# Patient Record
Sex: Male | Born: 1970 | Race: Black or African American | Hispanic: No | Marital: Married
Health system: Southern US, Community
[De-identification: ages and names within clinical notes are randomized; demographics above are authoritative.]

## PROBLEM LIST (undated history)

## (undated) DIAGNOSIS — H409 Unspecified glaucoma: Secondary | ICD-10-CM

---

## 2020-08-03 DIAGNOSIS — H35423 Microcystoid degeneration of retina, bilateral: Secondary | ICD-10-CM | POA: Diagnosis not present

## 2020-08-03 DIAGNOSIS — H18523 Epithelial (juvenile) corneal dystrophy, bilateral: Secondary | ICD-10-CM | POA: Diagnosis not present

## 2020-08-03 DIAGNOSIS — H5213 Myopia, bilateral: Secondary | ICD-10-CM | POA: Diagnosis not present

## 2020-08-03 DIAGNOSIS — H401231 Low-tension glaucoma, bilateral, mild stage: Secondary | ICD-10-CM | POA: Diagnosis not present

## 2021-11-15 DIAGNOSIS — H35423 Microcystoid degeneration of retina, bilateral: Secondary | ICD-10-CM | POA: Diagnosis not present

## 2021-11-15 DIAGNOSIS — H401231 Low-tension glaucoma, bilateral, mild stage: Secondary | ICD-10-CM | POA: Diagnosis not present

## 2021-11-15 DIAGNOSIS — H18523 Epithelial (juvenile) corneal dystrophy, bilateral: Secondary | ICD-10-CM | POA: Diagnosis not present

## 2021-11-15 DIAGNOSIS — H5213 Myopia, bilateral: Secondary | ICD-10-CM | POA: Diagnosis not present

## 2021-12-19 ENCOUNTER — Emergency Department (HOSPITAL_BASED_OUTPATIENT_CLINIC_OR_DEPARTMENT_OTHER): Payer: BC Managed Care – PPO

## 2021-12-19 ENCOUNTER — Emergency Department (HOSPITAL_BASED_OUTPATIENT_CLINIC_OR_DEPARTMENT_OTHER)
Admission: EM | Admit: 2021-12-19 | Discharge: 2021-12-19 | Disposition: A | Discharge status: Home / Self Care | Payer: BC Managed Care – PPO | Attending: Emergency Medicine | Admitting: Emergency Medicine

## 2021-12-19 ENCOUNTER — Encounter (HOSPITAL_BASED_OUTPATIENT_CLINIC_OR_DEPARTMENT_OTHER): Payer: Self-pay | Admitting: Emergency Medicine

## 2021-12-19 ENCOUNTER — Other Ambulatory Visit: Payer: Self-pay

## 2021-12-19 DIAGNOSIS — N132 Hydronephrosis with renal and ureteral calculous obstruction: Secondary | ICD-10-CM | POA: Diagnosis not present

## 2021-12-19 DIAGNOSIS — N201 Calculus of ureter: Secondary | ICD-10-CM | POA: Diagnosis not present

## 2021-12-19 DIAGNOSIS — R1032 Left lower quadrant pain: Secondary | ICD-10-CM | POA: Diagnosis not present

## 2021-12-19 DIAGNOSIS — N134 Hydroureter: Secondary | ICD-10-CM | POA: Diagnosis not present

## 2021-12-19 DIAGNOSIS — R110 Nausea: Secondary | ICD-10-CM | POA: Diagnosis not present

## 2021-12-19 DIAGNOSIS — K920 Hematemesis: Secondary | ICD-10-CM

## 2021-12-19 DIAGNOSIS — R109 Unspecified abdominal pain: Secondary | ICD-10-CM | POA: Diagnosis not present

## 2021-12-19 HISTORY — DX: Unspecified glaucoma: H40.9

## 2021-12-19 LAB — COMPREHENSIVE METABOLIC PANEL
ALT: 34 U/L (ref 0–44)
AST: 30 U/L (ref 15–41)
Albumin: 4 g/dL (ref 3.5–5.0)
Alkaline Phosphatase: 47 U/L (ref 38–126)
Anion gap: 9 (ref 5–15)
BUN: 17 mg/dL (ref 6–20)
CO2: 26 mmol/L (ref 22–32)
Calcium: 9 mg/dL (ref 8.9–10.3)
Chloride: 102 mmol/L (ref 98–111)
Creatinine, Ser: 1.17 mg/dL (ref 0.61–1.24)
GFR, Estimated: >60 mL/min (ref >60)
Glucose, Bld: 150 mg/dL — ABNORMAL HIGH (ref 70–99)
Potassium: 4.1 mmol/L (ref 3.5–5.1)
Sodium: 137 mmol/L (ref 135–145)
Total Bilirubin: 0.8 mg/dL (ref 0.3–1.2)
Total Protein: 7.7 g/dL (ref 6.5–8.1)

## 2021-12-19 LAB — URINALYSIS, ROUTINE W REFLEX MICROSCOPIC
Bilirubin Urine: NEGATIVE
Glucose, UA: NEGATIVE mg/dL
Ketones, ur: NEGATIVE mg/dL
Leukocytes,Ua: NEGATIVE
Nitrite: NEGATIVE
Protein, ur: NEGATIVE mg/dL
Specific Gravity, Urine: 1.025 (ref 1.005–1.030)
pH: 6 (ref 5.0–8.0)

## 2021-12-19 LAB — CBC
HCT: 40.3 % (ref 39.0–52.0)
Hemoglobin: 13.2 g/dL (ref 13.0–17.0)
MCH: 28.8 pg (ref 26.0–34.0)
MCHC: 32.8 g/dL (ref 30.0–36.0)
MCV: 87.8 fL (ref 80.0–100.0)
Platelets: 166 10*3/uL (ref 150–400)
RBC: 4.59 MIL/uL (ref 4.22–5.81)
RDW: 15.2 % (ref 11.5–15.5)
WBC: 8.2 10*3/uL (ref 4.0–10.5)
nRBC: 0 % (ref 0.0–0.2)

## 2021-12-19 LAB — URINALYSIS, MICROSCOPIC (REFLEX)

## 2021-12-19 LAB — LIPASE, BLOOD: Lipase: 31 U/L (ref 11–51)

## 2021-12-19 MED ORDER — ACETAMINOPHEN 325 MG PO TABS
650.0000 mg | ORAL_TABLET | Freq: Once | ORAL | Status: AC
  Administered 2021-12-19: 650 mg via ORAL
  Filled 2021-12-19: qty 2

## 2021-12-19 MED ORDER — KETOROLAC TROMETHAMINE 15 MG/ML IJ SOLN
15.0000 mg | Freq: Once | INTRAMUSCULAR | Status: DC
  Filled 2021-12-19: qty 1

## 2021-12-19 MED ORDER — IOHEXOL 300 MG/ML  SOLN
100.0000 mL | Freq: Once | INTRAMUSCULAR | Status: AC | PRN
  Administered 2021-12-19: 100 mL via INTRAVENOUS

## 2021-12-19 MED ORDER — ONDANSETRON HCL 4 MG/2ML IJ SOLN
4.0000 mg | Freq: Once | INTRAMUSCULAR | Status: AC
Start: 2021-12-19 — End: 2021-12-19
  Administered 2021-12-19: 4 mg via INTRAVENOUS
  Filled 2021-12-19: qty 2

## 2021-12-19 MED ORDER — OXYCODONE HCL 5 MG PO TABS: 5.0000 mg | ORAL_TABLET | Freq: Three times a day (TID) | ORAL | 0 refills | Status: AC | PRN

## 2021-12-19 MED ORDER — TAMSULOSIN HCL 0.4 MG PO CAPS: 0.4000 mg | ORAL_CAPSULE | Freq: Every day | ORAL | 0 refills | Status: AC

## 2021-12-19 NOTE — ED Notes (Signed)
Patient transported to CT 

## 2021-12-19 NOTE — ED Provider Notes (Signed)
?[facility] EMERGENCY DEPARTMENT ?Provider Note ? ? ?CSN: [id number] ?Arrival date & time: 12/19/21  1142 ? ?  ? ?History ? ?Chief Complaint  ?Patient presents with  ? Flank Pain  ? ? ?Martin Robbins is a 51 y.o. male presenting with left lower quadrant and flank pain.  Reports that this morning while taking his daughter to school he had a sudden onset of left-sided abdominal pain.  Says that he felt as though he needed to take a bowel movement so he went home to have a bowel movement which did not relieve his pain.  Went to work however pain continued and he had to go home.  At home he had 1 episode of hematemesis which prompted his emergency department visit today.  Denies history of GI bleed, NSAID use, alcohol use, history of UC/Crohn's/colorectal cancer.  Is not on blood thinners.  Does not have a GI provider and has never had an endoscopy/colonoscopy. ? ? ?Home Medications ?Prior to Admission medications   ?Not on File  ?   ? ?Allergies    ?Patient has no allergy information on record.   ? ?Review of Systems   ?Review of Systems  ?Gastrointestinal:  Positive for abdominal pain, nausea and vomiting. Negative for blood in stool, constipation and diarrhea.  ?Genitourinary:  Positive for flank pain. Negative for dysuria and hematuria.  ?Neurological:  Negative for dizziness and weakness.  ? ?Physical Exam ?Updated Vital Signs ?BP 114/66   Pulse 76   Temp 97.6 ?F (36.4 ?C) (Oral)   Resp 18   Ht 5\' 7"  (1.702 m)   Wt 83.9 kg   SpO2 100%   BMI 28.98 kg/m?  ?Physical Exam ?Vitals and nursing note reviewed.  ?Constitutional:   ?   General: He is not in acute distress. ?   Appearance: Normal appearance. He is not ill-appearing.  ?HENT:  ?   Head: Normocephalic and atraumatic.  ?Eyes:  ?   General: No scleral icterus. ?   Conjunctiva/sclera: Conjunctivae normal.  ?Cardiovascular:  ?   Rate and Rhythm: Normal rate and regular rhythm.  ?Pulmonary:  ?   Effort: Pulmonary effort is normal. No  respiratory distress.  ?Abdominal:  ?   General: Abdomen is flat.  ?   Palpations: Abdomen is soft.  ?   Tenderness: There is abdominal tenderness (Tenderness localized to muscles of the left flank, no CVA tenderness, mild left lower quadrant tenderness). There is no right CVA tenderness or left CVA tenderness.  ?Skin: ?   General: Skin is warm and dry.  ?   Findings: No rash.  ?Neurological:  ?   Mental Status: He is alert.  ?Psychiatric:     ?   Mood and Affect: Mood normal.     ?   Behavior: Behavior normal.  ? ? ?ED Results / Procedures / Treatments   ?Labs ?(all labs ordered are listed, but only abnormal results are displayed) ?Labs Reviewed  ?COMPREHENSIVE METABOLIC PANEL - Abnormal; Notable for the following components:  ?    Result Value  ? Glucose, Bld 150 (*)   ? All other components within normal limits  ?URINALYSIS, ROUTINE W REFLEX MICROSCOPIC - Abnormal; Notable for the following components:  ? Hgb urine dipstick TRACE (*)   ? All other components within normal limits  ?URINALYSIS, MICROSCOPIC (REFLEX) - Abnormal; Notable for the following components:  ? Bacteria, UA FEW (*)   ? All other components within normal limits  ?LIPASE, BLOOD  ?CBC  ? ? ?  EKG ?None ? ?Radiology ?CT ABDOMEN PELVIS W CONTRAST ? ?Result Date: 12/19/2021 ?CLINICAL DATA:  Abdominal pain, acute, nonlocalized EXAM: CT ABDOMEN AND PELVIS WITH CONTRAST TECHNIQUE: Multidetector CT imaging of the abdomen and pelvis was performed using the standard protocol following bolus administration of intravenous contrast. RADIATION DOSE REDUCTION: This exam was performed according to the departmental dose-optimization program which includes automated exposure control, adjustment of the mA and/or kV according to patient size and/or use of iterative reconstruction technique. CONTRAST:  OMNIPAQUE IOHEXOL 300 MG/ML  SOLN COMPARISON:  None. FINDINGS: Lower chest: No acute abnormality. Hepatobiliary: No focal liver abnormality is seen. The  gallbladder is unremarkable. Pancreas: Unremarkable. No pancreatic ductal dilatation or surrounding inflammatory changes. Spleen: Normal in size without focal abnormality. Adrenals/Urinary Tract: Adrenal glands are unremarkable. There is moderate left-sided hydroureteronephrosis with perinephric and periureteral stranding due to an obstructing 4 mm stone at the left ureterovesicular junction. Mild bladder distension. The right kidney is unremarkable. Stomach/Bowel: The stomach is within normal limits. There is no evidence of bowel obstruction.The appendix is normal in caliber with minimal hyperdense material within the midportion. No periappendiceal stranding. Vascular/Lymphatic: No significant vascular findings are present. No enlarged abdominal or pelvic lymph nodes. Reproductive: Unremarkable. Other: No abdominal wall hernia or abnormality. No abdominopelvic ascites. Musculoskeletal: No acute or significant osseous findings. IMPRESSION: Obstructing 4 mm stone at the left ureterovesicular junction with upstream moderate left hydroureteronephrosis, perinephric and periureteral stranding. Electronically Signed   By: Caprice Renshaw M.D.   On: 12/19/2021 14:22   ? ?Procedures ?Procedures  ? ? ?Medications Ordered in ED ?Medications  ?ondansetron (ZOFRAN) injection 4 mg (4 mg Intravenous Given 12/19/21 1445)  ?iohexol (OMNIPAQUE) 300 MG/ML solution 100 mL (100 mLs Intravenous Contrast Given 12/19/21 1358)  ?acetaminophen (TYLENOL) tablet 650 mg (650 mg Oral Given 12/19/21 1444)  ? ? ?ED Course/ Medical Decision Making/ A&P ?  ?                        ?Medical Decision Making ?Amount and/or Complexity of Data Reviewed ?Labs: ordered. ?Radiology: ordered. ? ?Risk ?OTC drugs. ?Prescription drug management. ? ? ?Patient presents to the ED for concern of left flank/lower quadrant pain and hematemesis.  He has a photo of his hematemesis which appears bright red.  Differential includes but is not limited to diverticulitis,  gastroenteritis, pyelonephritis, nephrolithiasis, GI bleed, peptic ulcer disease and gastritis.  ? ?I reviewed patient's external charts with [facility] and there are no pertinent findings. ? ?I performed a full physical exam, pertinent findings include: ?Moderate tenderness to left lower quadrant.  Negative CVA bilaterally ? ?Diagnostics: ? ?I ordered and reviewed labs and the pertinent are as follows: ?Normal white blood cell count, normal kidney function and microcytic hematuria ? ? ?I ordered and viewed patient's CT abdomen which was revealing of a 4 mm UVJ stone with some associated hydronephrosis. ? ? ?Treatment: ? ?I ordered Tylenol for patient's pain.  He was hesitant to take any NSAIDs due to his episode of hematemesis. ? ?MDM/Disposition: ? ?51 year old male diagnosed with a UVJ stone.  This does not explain his episode of hematemesis.  Because his hemoglobin is stable I believe he is stable to follow-up with gastroenterology outpatient about his episode of hematemesis for a potential endoscopy.  He reports he has not had a colonoscopy so it is a good idea for him to establish care with them anyways. ? ?Patient will probably pass this kidney stone on his own  however I have sent Flomax and oxycodone to his pharmacy to assist.  He has been given a referral to urology for him to follow-up with as needed if he continues to have symptoms and is unable to pass the stone alone. ? ? ?He denies any further questions and voices understanding of return precautions. ? ? ?Final Clinical Impression(s) / ED Diagnoses ?Final diagnoses:  ?Calculus of ureterovesical junction (UVJ)  ?Hematemesis without nausea  ? ? ?Rx / DC Orders ?ED Discharge Orders   ? ?      Ordered  ?  tamsulosin (FLOMAX) 0.4 MG CAPS capsule  Daily after breakfast       ? 12/19/21 1533  ?  oxyCODONE (ROXICODONE) 5 MG immediate release tablet  3 times daily PRN       ? 12/19/21 1533  ? ?  ?  ? ?  ? ?Results and diagnoses were explained to the patient.  Return precautions discussed in full. Patient had no additional questions and expressed complete understanding. ? ? ?This chart was dictated using voice recognition software.  Despite best efforts to proofread,  errors can occur whi

## 2021-12-19 NOTE — ED Notes (Signed)
Pt refused Toradol, He states he does not feel comfortable taking it.  He states he vomited blood and since Toradol is an NSAID, he does not wish to take it.  EDP notfied ?

## 2021-12-19 NOTE — Discharge Instructions (Addendum)
Please follow-up with the urology office if you are still having symptoms after the next week.  Only use the oxycodone for severe pain, otherwise you may take Tylenol. ? ?There is a GI practice attached to these discharge papers however you may see whoever you may prefer if you continue to have hematemesis and need further evaluation ?

## 2021-12-19 NOTE — ED Triage Notes (Signed)
Left flank pain today , restless in triage , also reports mid abdominal pain and vomited bright red blood x 1 , on his way to ER.  ?Denies kidney stone  ?

## 2022-01-14 DIAGNOSIS — Z0001 Encounter for general adult medical examination with abnormal findings: Secondary | ICD-10-CM | POA: Diagnosis not present

## 2022-01-14 DIAGNOSIS — Z136 Encounter for screening for cardiovascular disorders: Secondary | ICD-10-CM | POA: Diagnosis not present

## 2022-01-14 DIAGNOSIS — Z125 Encounter for screening for malignant neoplasm of prostate: Secondary | ICD-10-CM | POA: Diagnosis not present

## 2022-01-14 DIAGNOSIS — Z131 Encounter for screening for diabetes mellitus: Secondary | ICD-10-CM | POA: Diagnosis not present

## 2022-01-14 DIAGNOSIS — Z1329 Encounter for screening for other suspected endocrine disorder: Secondary | ICD-10-CM | POA: Diagnosis not present

## 2022-01-24 DIAGNOSIS — N201 Calculus of ureter: Secondary | ICD-10-CM | POA: Diagnosis not present

## 2022-02-14 DIAGNOSIS — K920 Hematemesis: Secondary | ICD-10-CM | POA: Diagnosis not present

## 2022-02-14 DIAGNOSIS — Z1211 Encounter for screening for malignant neoplasm of colon: Secondary | ICD-10-CM | POA: Diagnosis not present

## 2022-03-21 DIAGNOSIS — H401231 Low-tension glaucoma, bilateral, mild stage: Secondary | ICD-10-CM | POA: Diagnosis not present

## 2022-04-04 DIAGNOSIS — K648 Other hemorrhoids: Secondary | ICD-10-CM | POA: Diagnosis not present

## 2022-04-04 DIAGNOSIS — K2930 Chronic superficial gastritis without bleeding: Secondary | ICD-10-CM | POA: Diagnosis not present

## 2022-04-04 DIAGNOSIS — K5730 Diverticulosis of large intestine without perforation or abscess without bleeding: Secondary | ICD-10-CM | POA: Diagnosis not present

## 2022-04-04 DIAGNOSIS — K2950 Unspecified chronic gastritis without bleeding: Secondary | ICD-10-CM | POA: Diagnosis not present

## 2022-04-04 DIAGNOSIS — K6389 Other specified diseases of intestine: Secondary | ICD-10-CM | POA: Diagnosis not present

## 2022-04-04 DIAGNOSIS — K920 Hematemesis: Secondary | ICD-10-CM | POA: Diagnosis not present

## 2022-04-04 DIAGNOSIS — K317 Polyp of stomach and duodenum: Secondary | ICD-10-CM | POA: Diagnosis not present

## 2022-04-04 DIAGNOSIS — K529 Noninfective gastroenteritis and colitis, unspecified: Secondary | ICD-10-CM | POA: Diagnosis not present

## 2022-04-04 DIAGNOSIS — B9681 Helicobacter pylori [H. pylori] as the cause of diseases classified elsewhere: Secondary | ICD-10-CM | POA: Diagnosis not present

## 2022-04-04 DIAGNOSIS — K635 Polyp of colon: Secondary | ICD-10-CM | POA: Diagnosis not present

## 2022-04-04 DIAGNOSIS — Z1211 Encounter for screening for malignant neoplasm of colon: Secondary | ICD-10-CM | POA: Diagnosis not present

## 2022-04-15 DIAGNOSIS — R7303 Prediabetes: Secondary | ICD-10-CM | POA: Diagnosis not present

## 2022-04-15 DIAGNOSIS — E782 Mixed hyperlipidemia: Secondary | ICD-10-CM | POA: Diagnosis not present

## 2022-08-02 DIAGNOSIS — Z8619 Personal history of other infectious and parasitic diseases: Secondary | ICD-10-CM | POA: Diagnosis not present

## 2022-08-05 DIAGNOSIS — Z8619 Personal history of other infectious and parasitic diseases: Secondary | ICD-10-CM | POA: Diagnosis not present

## 2023-04-27 IMAGING — CT CT ABD-PELV W/ CM
2 of 5 series · 16 of 46 positions shown, 18 images · IV contrast (Omnipaque)
Comparison: None.

CLINICAL DATA: Abdominal pain, acute, nonlocalized

EXAM:
CT ABDOMEN AND PELVIS WITH CONTRAST
TECHNIQUE: Multidetector CT imaging of the abdomen and pelvis was performed
using the standard protocol following bolus administration of
intravenous contrast.

[Series 2: axial st · axial · 0.96mm/px · z∈[-520,-130]mm · 13 of 90 slices shown, 15 images]
[im 6/90  soft-tissue]
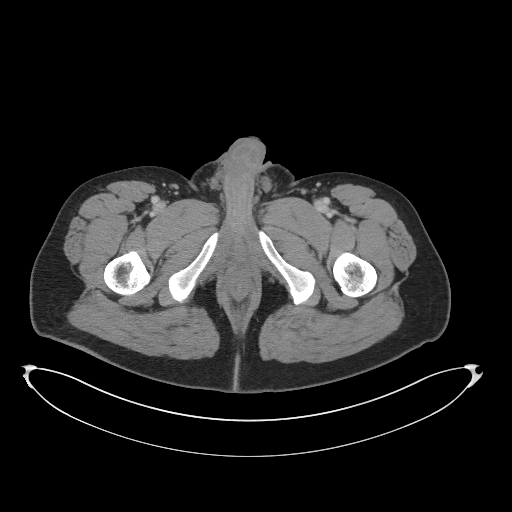
[im 6/90  bone]
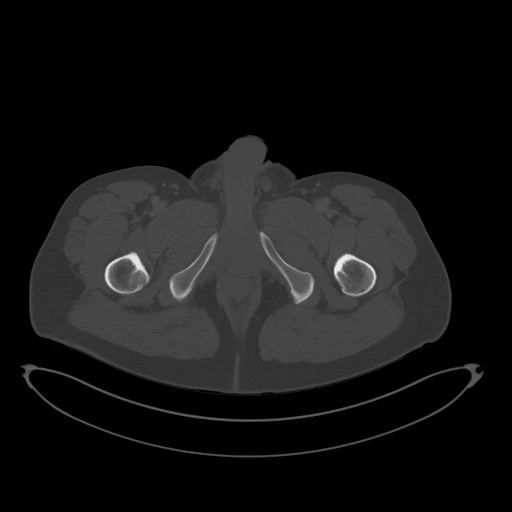
[im 11/90  soft-tissue]
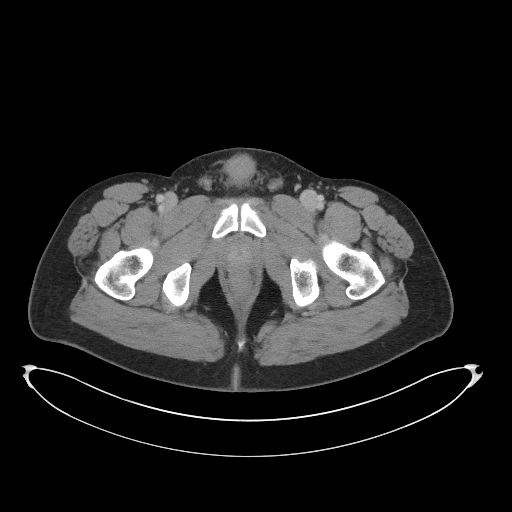
[im 21/90  soft-tissue]
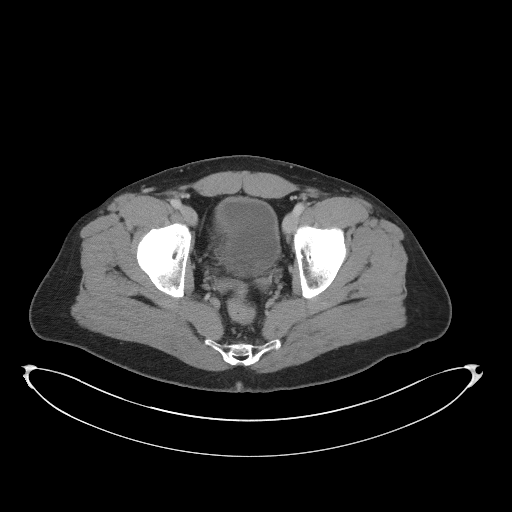
[im 27/90  soft-tissue]
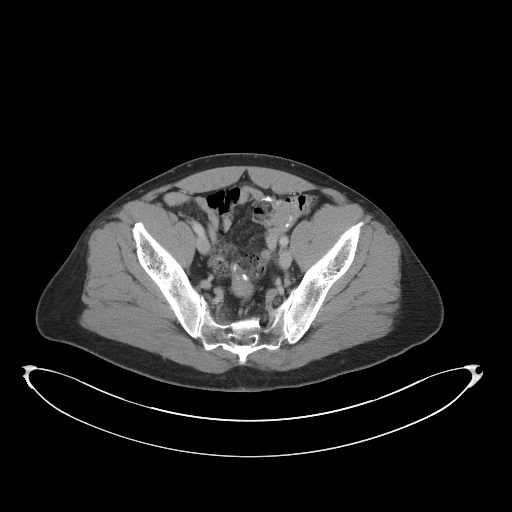
[im 32/90  soft-tissue]
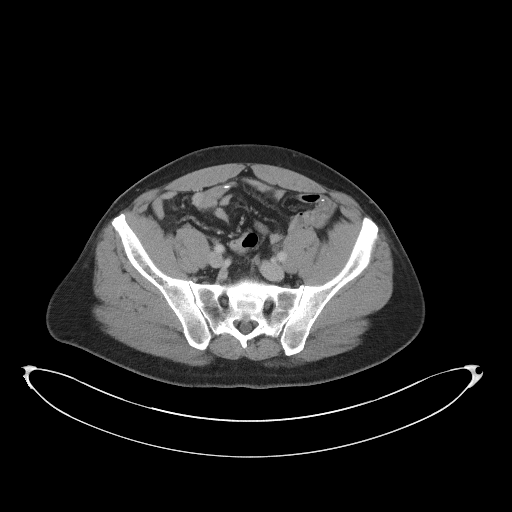
[im 37/90  soft-tissue]
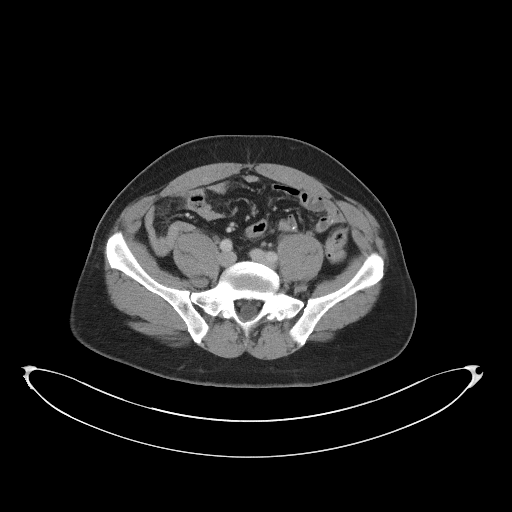
[im 48/90  soft-tissue]
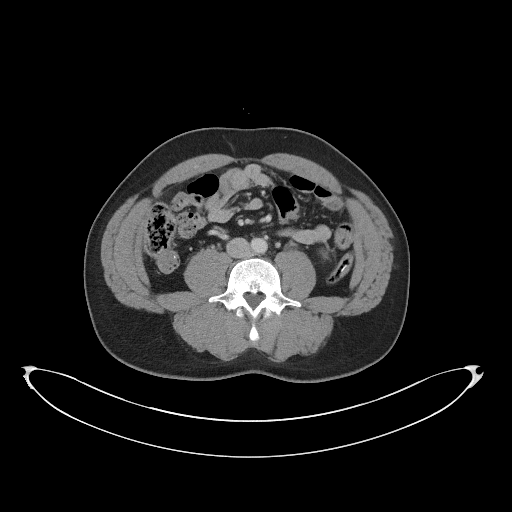
[im 53/90  soft-tissue]
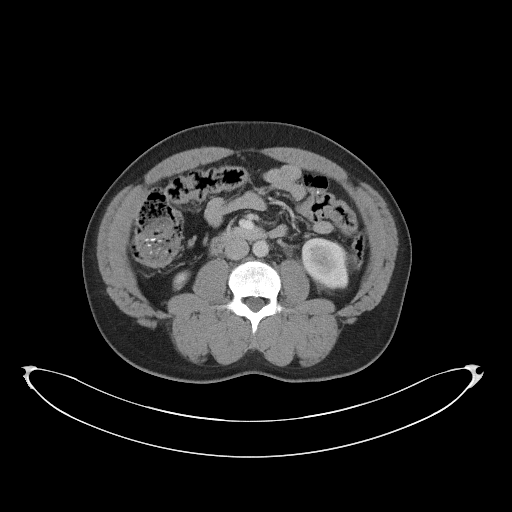
[im 58/90  soft-tissue]
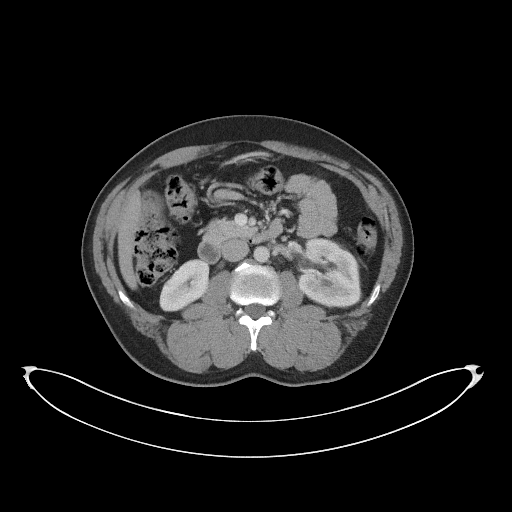
[im 58/90  bone]
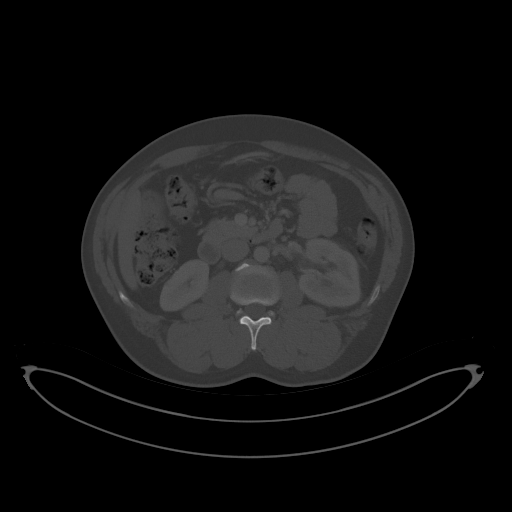
[im 63/90  soft-tissue]
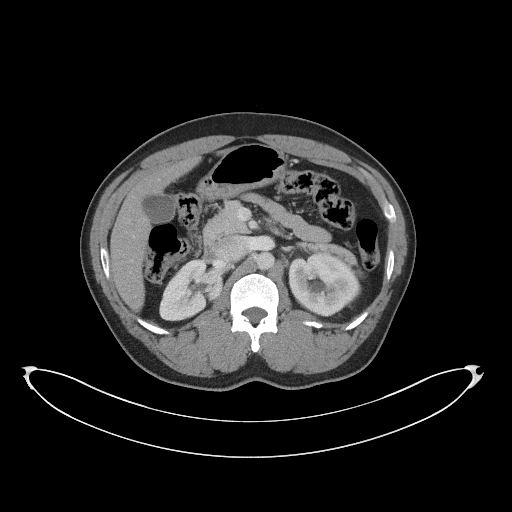
[im 69/90  soft-tissue]
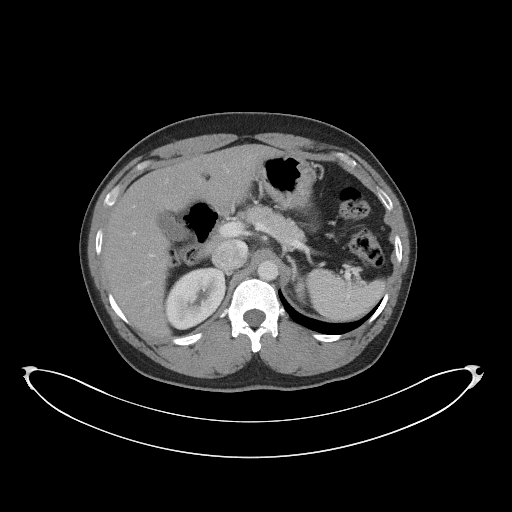
[im 79/90  soft-tissue]
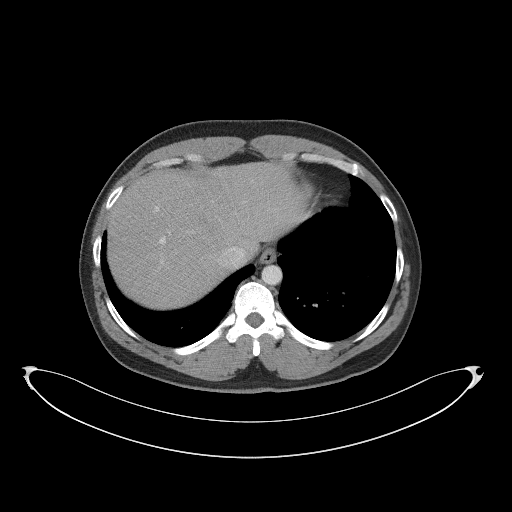
[im 84/90  soft-tissue]
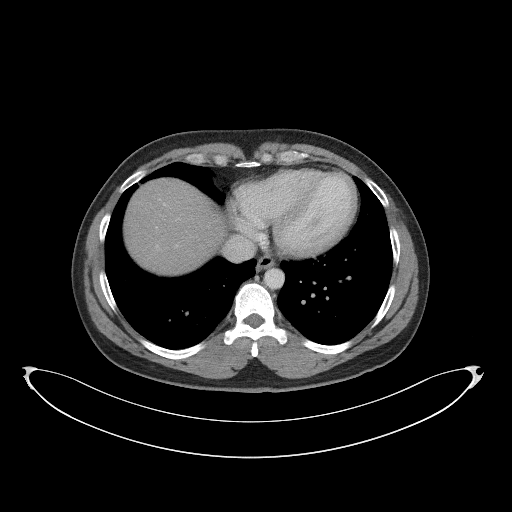

[Series 5: coronal st · coronal · 0.82mm/px · 3 of 105 slices shown]
[im 35/105  soft-tissue]
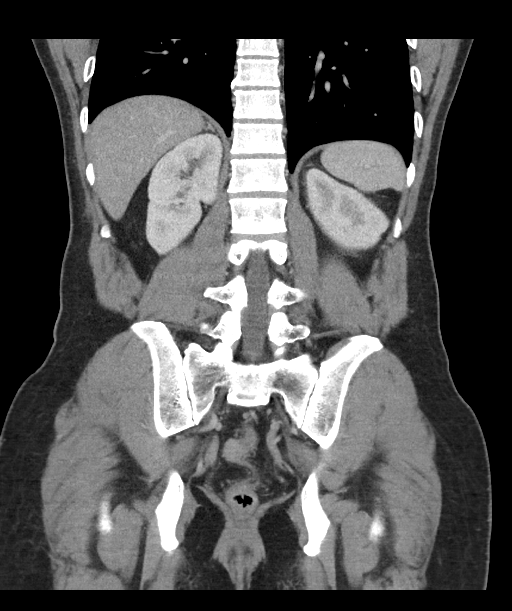
[im 47/105  soft-tissue]
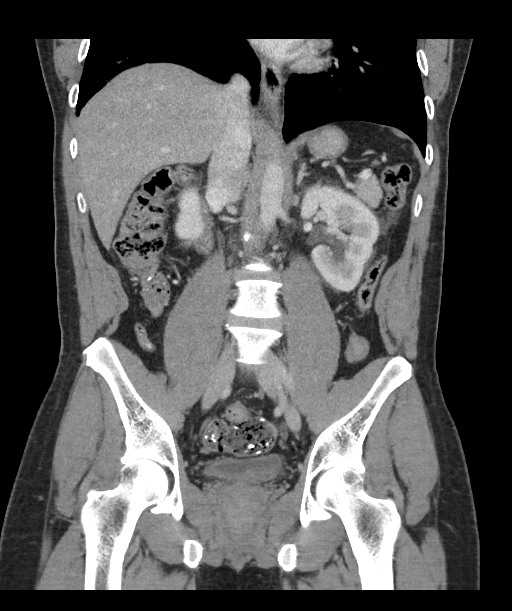
[im 58/105  soft-tissue]
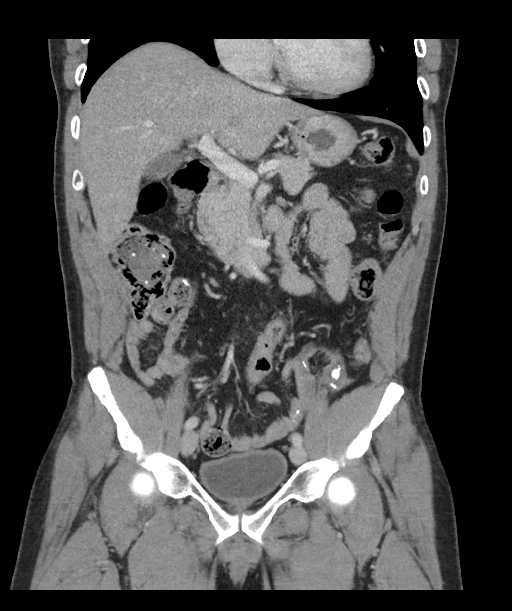

[16 of 46 positions shown; findings below may reference images not displayed]

RADIATION DOSE REDUCTION: This exam was performed according to the
departmental dose-optimization program which includes automated
exposure control, adjustment of the mA and/or kV according to
patient size and/or use of iterative reconstruction technique.

CONTRAST:  100mL OMNIPAQUE IOHEXOL 300 MG/ML  SOLN
FINDINGS: Lower chest: No acute abnormality.

Hepatobiliary: No focal liver abnormality is seen. The gallbladder
is unremarkable.

Pancreas: Unremarkable. No pancreatic ductal dilatation or
surrounding inflammatory changes.

Spleen: Normal in size without focal abnormality.

Adrenals/Urinary Tract: Adrenal glands are unremarkable. There is
moderate left-sided hydroureteronephrosis with perinephric and
periureteral stranding due to an obstructing 4 mm stone at the left
ureterovesicular junction. Mild bladder distension. The right kidney
is unremarkable.

Stomach/Bowel: The stomach is within normal limits. There is no
evidence of bowel obstruction.The appendix is normal in caliber with
minimal hyperdense material within the midportion. No
periappendiceal stranding.

Vascular/Lymphatic: No significant vascular findings are present. No
enlarged abdominal or pelvic lymph nodes.

Reproductive: Unremarkable.

Other: No abdominal wall hernia or abnormality. No abdominopelvic
ascites.

Musculoskeletal: No acute or significant osseous findings.
IMPRESSION: Obstructing 4 mm stone at the left ureterovesicular junction with
upstream moderate left hydroureteronephrosis, perinephric and
periureteral stranding.

## 2024-03-17 DIAGNOSIS — H524 Presbyopia: Secondary | ICD-10-CM | POA: Diagnosis not present

## 2024-03-17 DIAGNOSIS — H18523 Epithelial (juvenile) corneal dystrophy, bilateral: Secondary | ICD-10-CM | POA: Diagnosis not present

## 2024-03-17 DIAGNOSIS — H52223 Regular astigmatism, bilateral: Secondary | ICD-10-CM | POA: Diagnosis not present

## 2024-03-17 DIAGNOSIS — H40029 Open angle with borderline findings, high risk, unspecified eye: Secondary | ICD-10-CM | POA: Diagnosis not present

## 2024-03-17 DIAGNOSIS — H5213 Myopia, bilateral: Secondary | ICD-10-CM | POA: Diagnosis not present

## 2024-03-17 DIAGNOSIS — H35423 Microcystoid degeneration of retina, bilateral: Secondary | ICD-10-CM | POA: Diagnosis not present
